# Patient Record
Sex: Female | Born: 1960
Health system: Southern US, Community
[De-identification: ages and names within clinical notes are randomized; demographics above are authoritative.]

---

## 1998-09-13 ENCOUNTER — Other Ambulatory Visit: Admission: RE | Admit: 1998-09-13 | Discharge: 1998-09-13 | Payer: Self-pay | Admitting: *Deleted

## 1999-09-14 ENCOUNTER — Other Ambulatory Visit: Admission: RE | Admit: 1999-09-14 | Discharge: 1999-09-14 | Payer: Self-pay | Admitting: *Deleted

## 2001-09-16 ENCOUNTER — Emergency Department (HOSPITAL_COMMUNITY): Admission: EM | Admit: 2001-09-16 | Discharge: 2001-09-17 | Payer: Self-pay | Admitting: Emergency Medicine

## 2002-01-22 ENCOUNTER — Encounter: Admission: RE | Admit: 2002-01-22 | Discharge: 2002-01-22 | Payer: Self-pay | Admitting: Family Medicine

## 2002-01-22 ENCOUNTER — Encounter: Payer: Self-pay | Admitting: Family Medicine

## 2004-04-11 ENCOUNTER — Other Ambulatory Visit: Admission: RE | Admit: 2004-04-11 | Discharge: 2004-04-11 | Payer: Self-pay | Admitting: Family Medicine

## 2005-03-09 ENCOUNTER — Encounter: Admission: RE | Admit: 2005-03-09 | Discharge: 2005-03-09 | Payer: Self-pay | Admitting: Family Medicine

## 2005-04-17 ENCOUNTER — Other Ambulatory Visit: Admission: RE | Admit: 2005-04-17 | Discharge: 2005-04-17 | Payer: Self-pay | Admitting: Obstetrics and Gynecology

## 2010-10-19 ENCOUNTER — Other Ambulatory Visit: Payer: Self-pay | Admitting: Obstetrics and Gynecology

## 2010-10-19 DIAGNOSIS — R928 Other abnormal and inconclusive findings on diagnostic imaging of breast: Secondary | ICD-10-CM

## 2010-11-06 ENCOUNTER — Ambulatory Visit
Admission: RE | Admit: 2010-11-06 | Discharge: 2010-11-06 | Disposition: A | Discharge status: Home / Self Care | Payer: 59 | Source: Ambulatory Visit | Attending: Obstetrics and Gynecology | Admitting: Obstetrics and Gynecology

## 2010-11-06 DIAGNOSIS — R928 Other abnormal and inconclusive findings on diagnostic imaging of breast: Secondary | ICD-10-CM

## 2014-04-21 ENCOUNTER — Institutional Professional Consult (permissible substitution): Payer: 59 | Admitting: Pulmonary Disease

## 2014-04-23 ENCOUNTER — Encounter: Payer: Self-pay | Admitting: Pulmonary Disease

## 2016-04-17 ENCOUNTER — Other Ambulatory Visit: Payer: Self-pay

## 2016-04-17 ENCOUNTER — Other Ambulatory Visit: Payer: Self-pay | Admitting: Family Medicine

## 2016-04-17 ENCOUNTER — Ambulatory Visit
Admission: RE | Admit: 2016-04-17 | Discharge: 2016-04-17 | Disposition: A | Discharge status: Home / Self Care | Payer: 59 | Source: Ambulatory Visit | Attending: Family Medicine | Admitting: Family Medicine

## 2016-04-17 DIAGNOSIS — R079 Chest pain, unspecified: Secondary | ICD-10-CM

## 2016-04-17 DIAGNOSIS — M25511 Pain in right shoulder: Secondary | ICD-10-CM

## 2016-04-18 ENCOUNTER — Other Ambulatory Visit: Payer: Self-pay | Admitting: Family Medicine

## 2016-04-18 ENCOUNTER — Ambulatory Visit
Admission: RE | Admit: 2016-04-18 | Discharge: 2016-04-18 | Disposition: A | Discharge status: Home / Self Care | Payer: 59 | Source: Ambulatory Visit | Attending: Family Medicine | Admitting: Family Medicine

## 2016-04-18 DIAGNOSIS — R9389 Abnormal findings on diagnostic imaging of other specified body structures: Secondary | ICD-10-CM

## 2016-04-18 DIAGNOSIS — S2241XA Multiple fractures of ribs, right side, initial encounter for closed fracture: Secondary | ICD-10-CM

## 2016-04-18 MED ORDER — IOPAMIDOL (ISOVUE-300) INJECTION 61%
75.0000 mL | Freq: Once | INTRAVENOUS | Status: AC | PRN
  Administered 2016-04-18: 75 mL via INTRAVENOUS

## 2016-05-03 ENCOUNTER — Other Ambulatory Visit (HOSPITAL_COMMUNITY): Payer: Self-pay | Admitting: Orthopedic Surgery

## 2016-05-03 DIAGNOSIS — S2241XA Multiple fractures of ribs, right side, initial encounter for closed fracture: Secondary | ICD-10-CM

## 2016-05-11 ENCOUNTER — Encounter (HOSPITAL_COMMUNITY)
Admission: RE | Admit: 2016-05-11 | Discharge: 2016-05-11 | Disposition: A | Discharge status: Home / Self Care | Payer: 59 | Source: Ambulatory Visit | Attending: Orthopedic Surgery | Admitting: Orthopedic Surgery

## 2016-05-11 ENCOUNTER — Other Ambulatory Visit (HOSPITAL_COMMUNITY): Payer: Self-pay | Admitting: Orthopedic Surgery

## 2016-05-11 DIAGNOSIS — S2241XA Multiple fractures of ribs, right side, initial encounter for closed fracture: Secondary | ICD-10-CM | POA: Diagnosis not present

## 2016-05-11 DIAGNOSIS — X58XXXA Exposure to other specified factors, initial encounter: Secondary | ICD-10-CM | POA: Insufficient documentation

## 2016-05-11 MED ORDER — TECHNETIUM TC 99M MEDRONATE IV KIT
25.0000 | PACK | Freq: Once | INTRAVENOUS | Status: AC | PRN
  Administered 2016-05-11: 25 via INTRAVENOUS

## 2016-05-29 DIAGNOSIS — S2241XD Multiple fractures of ribs, right side, subsequent encounter for fracture with routine healing: Secondary | ICD-10-CM | POA: Diagnosis not present

## 2016-07-27 ENCOUNTER — Other Ambulatory Visit: Payer: Self-pay | Admitting: Family Medicine

## 2016-07-27 ENCOUNTER — Other Ambulatory Visit (HOSPITAL_COMMUNITY)
Admission: RE | Admit: 2016-07-27 | Discharge: 2016-07-27 | Disposition: A | Discharge status: Home / Self Care | Payer: 59 | Source: Ambulatory Visit | Attending: Family Medicine | Admitting: Family Medicine

## 2016-07-27 DIAGNOSIS — Z01411 Encounter for gynecological examination (general) (routine) with abnormal findings: Secondary | ICD-10-CM | POA: Insufficient documentation

## 2016-07-27 DIAGNOSIS — Z1151 Encounter for screening for human papillomavirus (HPV): Secondary | ICD-10-CM | POA: Diagnosis not present

## 2016-07-27 DIAGNOSIS — Z1231 Encounter for screening mammogram for malignant neoplasm of breast: Secondary | ICD-10-CM

## 2016-08-02 LAB — CYTOLOGY - PAP
Diagnosis: NEGATIVE
HPV: NOT DETECTED

## 2016-08-08 DIAGNOSIS — I10 Essential (primary) hypertension: Secondary | ICD-10-CM | POA: Diagnosis not present

## 2016-08-14 DIAGNOSIS — D0461 Carcinoma in situ of skin of right upper limb, including shoulder: Secondary | ICD-10-CM | POA: Diagnosis not present

## 2016-08-14 DIAGNOSIS — L570 Actinic keratosis: Secondary | ICD-10-CM | POA: Diagnosis not present

## 2016-08-15 ENCOUNTER — Ambulatory Visit: Payer: 59

## 2016-08-15 ENCOUNTER — Ambulatory Visit
Admission: RE | Admit: 2016-08-15 | Discharge: 2016-08-15 | Disposition: A | Discharge status: Home / Self Care | Payer: 59 | Source: Ambulatory Visit | Attending: Family Medicine | Admitting: Family Medicine

## 2016-08-15 DIAGNOSIS — Z1231 Encounter for screening mammogram for malignant neoplasm of breast: Secondary | ICD-10-CM | POA: Diagnosis not present

## 2017-10-17 ENCOUNTER — Other Ambulatory Visit: Payer: Self-pay | Admitting: Family Medicine

## 2017-10-17 DIAGNOSIS — Z1231 Encounter for screening mammogram for malignant neoplasm of breast: Secondary | ICD-10-CM

## 2017-11-07 ENCOUNTER — Ambulatory Visit: Payer: 59

## 2017-11-08 ENCOUNTER — Ambulatory Visit
Admission: RE | Admit: 2017-11-08 | Discharge: 2017-11-08 | Disposition: A | Discharge status: Home / Self Care | Payer: BLUE CROSS/BLUE SHIELD | Source: Ambulatory Visit | Attending: Family Medicine | Admitting: Family Medicine

## 2017-11-08 DIAGNOSIS — Z1231 Encounter for screening mammogram for malignant neoplasm of breast: Secondary | ICD-10-CM

## 2018-09-30 ENCOUNTER — Other Ambulatory Visit: Payer: Self-pay | Admitting: Family Medicine

## 2018-09-30 DIAGNOSIS — Z1231 Encounter for screening mammogram for malignant neoplasm of breast: Secondary | ICD-10-CM

## 2018-11-20 ENCOUNTER — Ambulatory Visit
Admission: RE | Admit: 2018-11-20 | Discharge: 2018-11-20 | Disposition: A | Discharge status: Home / Self Care | Payer: BLUE CROSS/BLUE SHIELD | Source: Ambulatory Visit | Attending: Family Medicine | Admitting: Family Medicine

## 2018-11-20 ENCOUNTER — Other Ambulatory Visit: Payer: Self-pay

## 2018-11-20 DIAGNOSIS — Z1231 Encounter for screening mammogram for malignant neoplasm of breast: Secondary | ICD-10-CM

## 2018-11-26 ENCOUNTER — Other Ambulatory Visit: Payer: Self-pay | Admitting: Family Medicine

## 2018-11-26 DIAGNOSIS — R2231 Localized swelling, mass and lump, right upper limb: Secondary | ICD-10-CM

## 2018-11-27 ENCOUNTER — Ambulatory Visit
Admission: RE | Admit: 2018-11-27 | Discharge: 2018-11-27 | Disposition: A | Discharge status: Home / Self Care | Payer: BC Managed Care – PPO | Source: Ambulatory Visit | Attending: Family Medicine | Admitting: Family Medicine

## 2018-11-27 DIAGNOSIS — R2231 Localized swelling, mass and lump, right upper limb: Secondary | ICD-10-CM

## 2019-03-23 ENCOUNTER — Other Ambulatory Visit: Payer: Self-pay

## 2019-03-23 DIAGNOSIS — Z20822 Contact with and (suspected) exposure to covid-19: Secondary | ICD-10-CM

## 2019-03-24 LAB — NOVEL CORONAVIRUS, NAA: SARS-CoV-2, NAA: NOT DETECTED

## 2019-12-08 ENCOUNTER — Other Ambulatory Visit (HOSPITAL_COMMUNITY)
Admission: RE | Admit: 2019-12-08 | Discharge: 2019-12-08 | Disposition: A | Discharge status: Home / Self Care | Payer: 59 | Source: Ambulatory Visit | Attending: Family Medicine | Admitting: Family Medicine

## 2019-12-08 ENCOUNTER — Other Ambulatory Visit: Payer: Self-pay | Admitting: Family Medicine

## 2019-12-08 DIAGNOSIS — Z01411 Encounter for gynecological examination (general) (routine) with abnormal findings: Secondary | ICD-10-CM | POA: Diagnosis not present

## 2019-12-10 LAB — CYTOLOGY - PAP
Comment: NEGATIVE
Diagnosis: NEGATIVE
High risk HPV: NEGATIVE

## 2019-12-22 ENCOUNTER — Other Ambulatory Visit: Payer: Self-pay | Admitting: Family Medicine

## 2019-12-22 DIAGNOSIS — Z1231 Encounter for screening mammogram for malignant neoplasm of breast: Secondary | ICD-10-CM

## 2019-12-31 ENCOUNTER — Other Ambulatory Visit: Payer: Self-pay

## 2019-12-31 ENCOUNTER — Ambulatory Visit
Admission: RE | Admit: 2019-12-31 | Discharge: 2019-12-31 | Disposition: A | Discharge status: Home / Self Care | Payer: 59 | Source: Ambulatory Visit | Attending: Family Medicine | Admitting: Family Medicine

## 2019-12-31 DIAGNOSIS — Z1231 Encounter for screening mammogram for malignant neoplasm of breast: Secondary | ICD-10-CM

## 2020-01-27 ENCOUNTER — Other Ambulatory Visit (HOSPITAL_BASED_OUTPATIENT_CLINIC_OR_DEPARTMENT_OTHER): Payer: Self-pay

## 2020-01-27 DIAGNOSIS — R0902 Hypoxemia: Secondary | ICD-10-CM

## 2020-01-27 DIAGNOSIS — G4733 Obstructive sleep apnea (adult) (pediatric): Secondary | ICD-10-CM

## 2020-02-27 ENCOUNTER — Other Ambulatory Visit: Payer: Self-pay

## 2020-02-27 ENCOUNTER — Ambulatory Visit (HOSPITAL_BASED_OUTPATIENT_CLINIC_OR_DEPARTMENT_OTHER): Payer: 59 | Attending: Internal Medicine | Admitting: Internal Medicine

## 2020-02-27 DIAGNOSIS — G4733 Obstructive sleep apnea (adult) (pediatric): Secondary | ICD-10-CM | POA: Diagnosis not present

## 2020-02-27 DIAGNOSIS — R0902 Hypoxemia: Secondary | ICD-10-CM

## 2020-03-02 NOTE — Procedures (Signed)
NAME: Felicia Summers DATE OF BIRTH:  December 03, 1960 MEDICAL RECORD NUMBER [id number]  LOCATION: [facility]  PHYSICIAN: Deretha Emory  DATE OF STUDY: 02/27/2020  SLEEP STUDY TYPE: Positive Airway Pressure Titration               REFERRING PHYSICIAN: Deretha Emory, MD  INDICATION FOR STUDY: Severe OSA and severe hypoxemia on HSAT. Concern for OHS.   EPWORTH SLEEPINESS SCORE:  10 HEIGHT: 5\' 3"  (160 cm)  WEIGHT: 230 lb (104.3 kg)    Body mass index is 40.74 kg/m.  NECK SIZE: 16 in.  MEDICATIONS Patient self administered medications include: Venaflaxine, AMLODIPINE, LISINOPRIL. Medications administered during study include No sleep medicine administered.  SLEEP STUDY TECHNIQUE The patient underwent an attended overnight polysomnography titration to assess the effects of cpap therapy. The following variables were monitored: EEG (C4-A1, C3-A2, O1-A2, O2-A1, F3-M2, F4-M1), EOG, submental and leg EMG, ECG, oxyhemoglobin saturation by pulse oximetry, thoracic and abdominal respiratory effort belts, nasal/oral airflow by pressure sensor, body position sensor and snoring sensor. CPAP pressure was titrated to eliminate apneas, hypopneas and oxygen desaturation.  TECHNICAL COMMENTS Comments added by Technician: Patient had more than two awakenings to use the bathroom Comments added by Scorer: N/A  SLEEP ARCHITECTURE The study was initiated at 10:15:36 PM and terminated at 5:09:01 AM. Total recorded time was 413.4 minutes. EEG confirmed total sleep time was 288.5 minutes yielding a sleep efficiency of 69.8%%. Sleep onset after lights out was 25.4 minutes with a REM latency of 127.0 minutes. The patient spent 6.1%% of the night in stage N1 sleep, 48.5%% in stage N2 sleep, 0.0%% in stage N3 and 45.4% in REM. The Arousal Index was 13.7/hour.  RESPIRATORY PARAMETERS The overall AHI was 21.6 per hour, and the RDI was 23.3 events/hour with a central apnea index of 2.9per  hour. The patient was started on CPAP 4. Pressures were increased due to continued events. She was eventually changed to BPAP due to higher pressures and CPAP intolerance. The most appropriate setting of BiPAP was IPAP/EPAP 21/17 cm H2O. At this setting, the sleep efficiency was 96 % and the patient was supine for 0%. The AHI was 4.7 events per hour, and the RDI was 4.7 events/hour (with 2.9 central events) and the arousal index was 14.1 per hour.The oxygen nadir was 93.0% at this pressure. It is noted that at CPAP of 16, the AHI was 8.1 events/hour and oxygen min was 89%.   LEG MOVEMENT DATA The total leg movements were 284 with a resulting leg movement index of 59.1. Associated arousal with leg movement index was 0.0.  CARDIAC DATA The underlying cardiac rhythm was most consistent with sinus rhythm. Mean heart rate during sleep was 77.3 bpm. Additional rhythm abnormalities include None.  IMPRESSIONS - Severe Obstructive Sleep Apnea - Severe Oxygen Desaturation - Optimal pressure attained with resolution in apnea and hypoxemia - Evidence of REM rebound.  - Patient indicated her sleep with this study was better than usual sleep at home  DIAGNOSIS - Obstructive Sleep Apnea (G47.33)  RECOMMENDATIONS - Trial of BiPAP therapy on 21/17 cm H2O with a Medium size Fisher&Paykel Full Face Mask F&P Vitera (new) mask and heated humidification. If BPAP is not tolerated, CPAP 15 cm H2O may be adequate and resolved hypoxemia.   03-27-1988 Sleep specialist, American Board of Internal Medicine  ELECTRONICALLY SIGNED ON:  03/02/2020, 7:46 PM [facility] PH: [phone]   FX: [phone]  [facility]

## 2021-01-30 ENCOUNTER — Other Ambulatory Visit: Payer: Self-pay | Admitting: Family Medicine

## 2021-01-30 DIAGNOSIS — Z1231 Encounter for screening mammogram for malignant neoplasm of breast: Secondary | ICD-10-CM

## 2021-03-02 ENCOUNTER — Ambulatory Visit
Admission: RE | Admit: 2021-03-02 | Discharge: 2021-03-02 | Disposition: A | Discharge status: Home / Self Care | Payer: BC Managed Care – PPO | Source: Ambulatory Visit | Attending: Family Medicine | Admitting: Family Medicine

## 2021-03-02 ENCOUNTER — Other Ambulatory Visit: Payer: Self-pay

## 2021-03-02 DIAGNOSIS — Z1231 Encounter for screening mammogram for malignant neoplasm of breast: Secondary | ICD-10-CM

## 2022-01-07 IMAGING — MG MM DIGITAL SCREENING BILAT W/ TOMO AND CAD
6 of 12 series · 6 of 36 positions shown · non-contrast
Comparison: Previous exam(s).

CLINICAL DATA: Screening.

EXAM:
DIGITAL SCREENING BILATERAL MAMMOGRAM WITH TOMOSYNTHESIS AND CAD
TECHNIQUE: Bilateral screening digital craniocaudal and mediolateral oblique
mammograms were obtained. Bilateral screening digital breast
tomosynthesis was performed. The images were evaluated with
computer-aided detection.

[L CC synth-2D (1 of 2)]
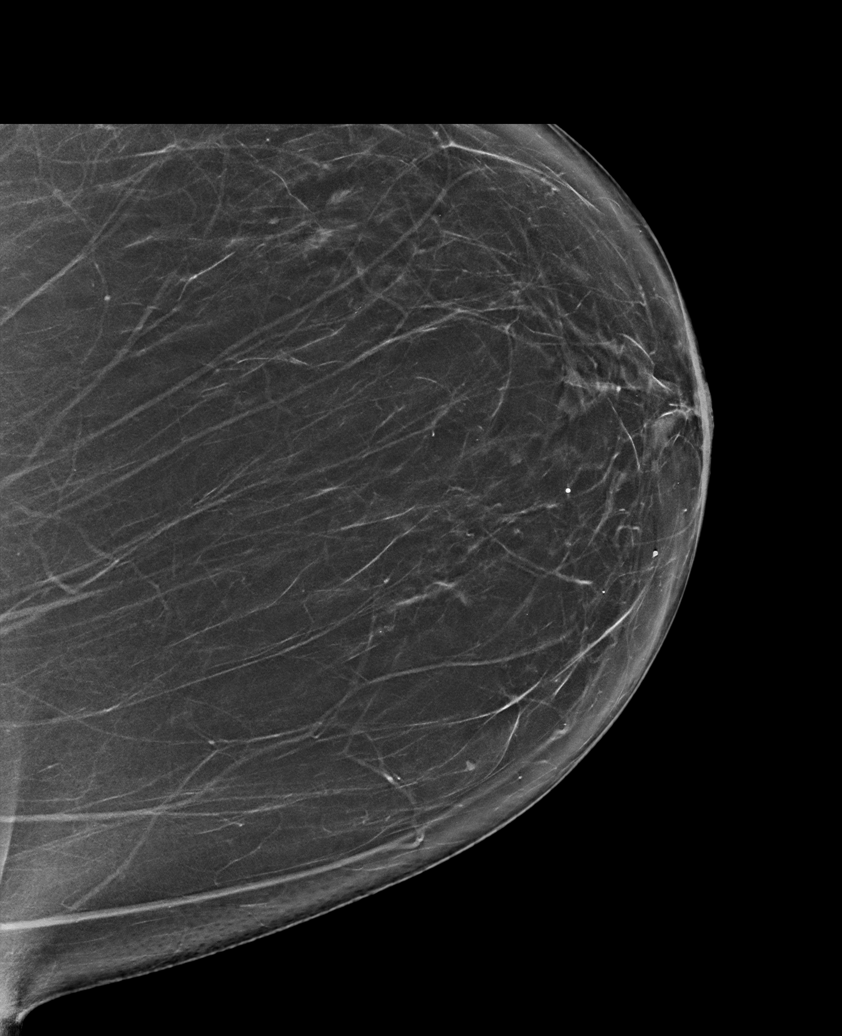

[R MLO synth-2D (1 of 2)]
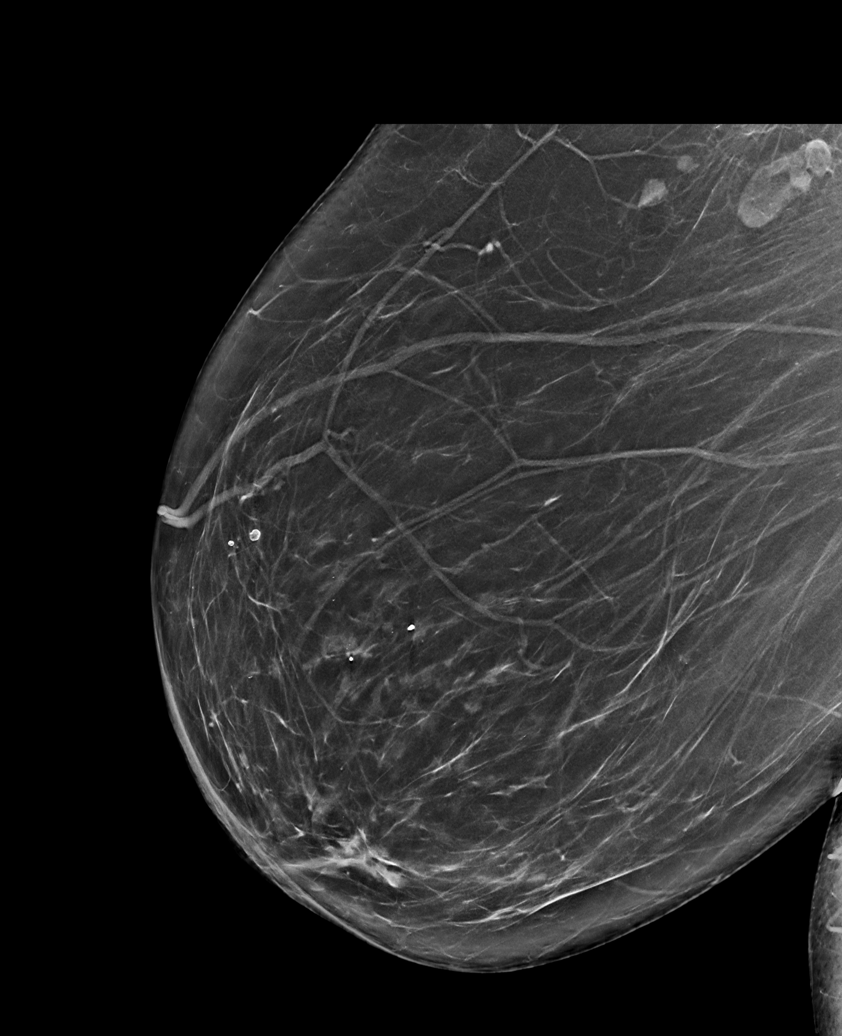

[L CC synth-2D (2 of 2)]
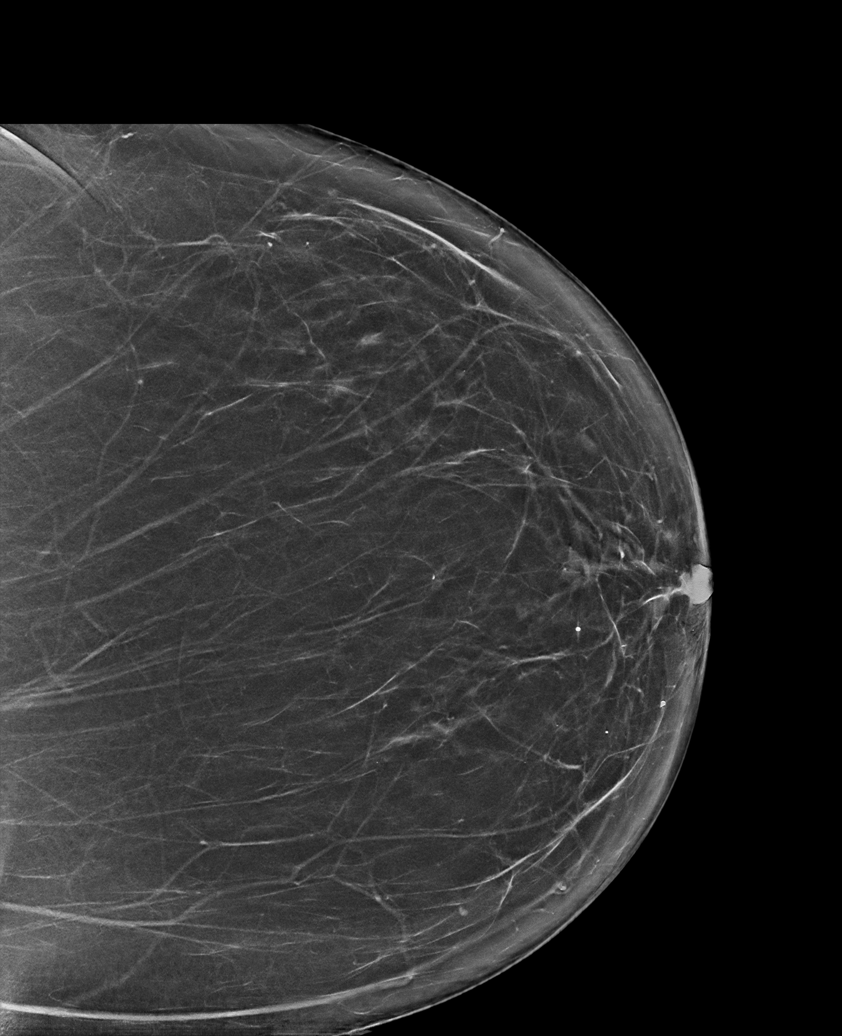

[R CC synth-2D]
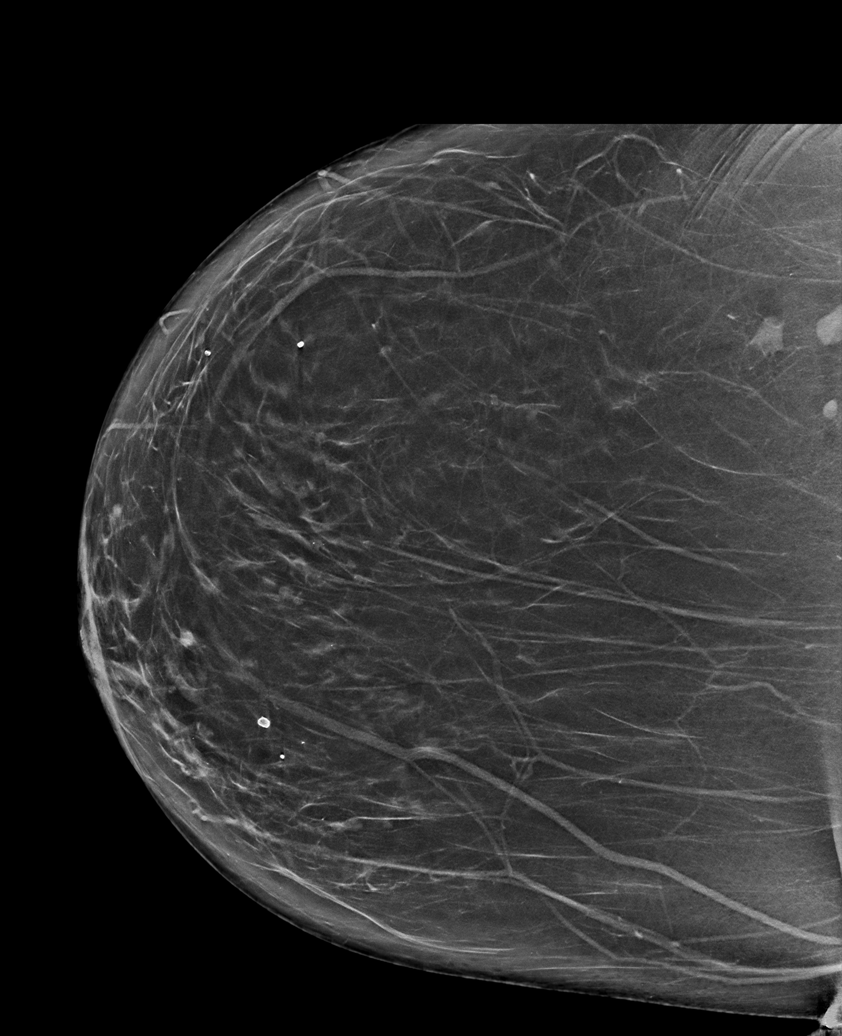

[R MLO synth-2D (2 of 2)]
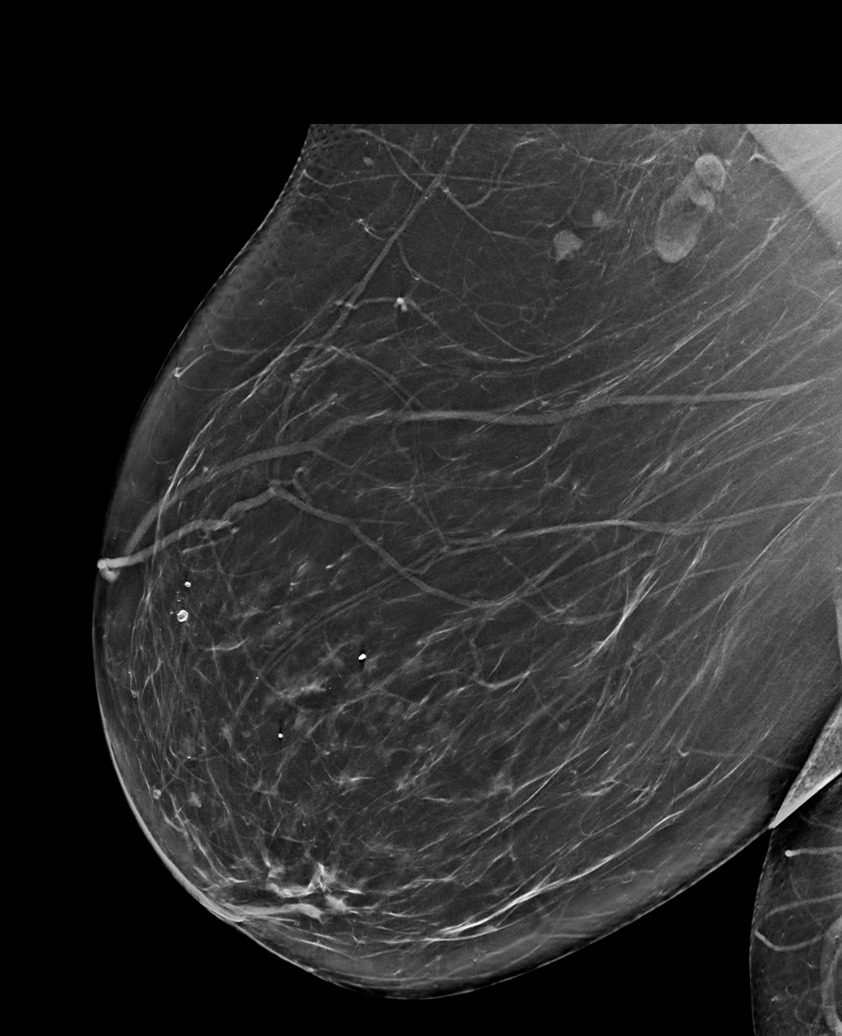

[L MLO synth-2D]
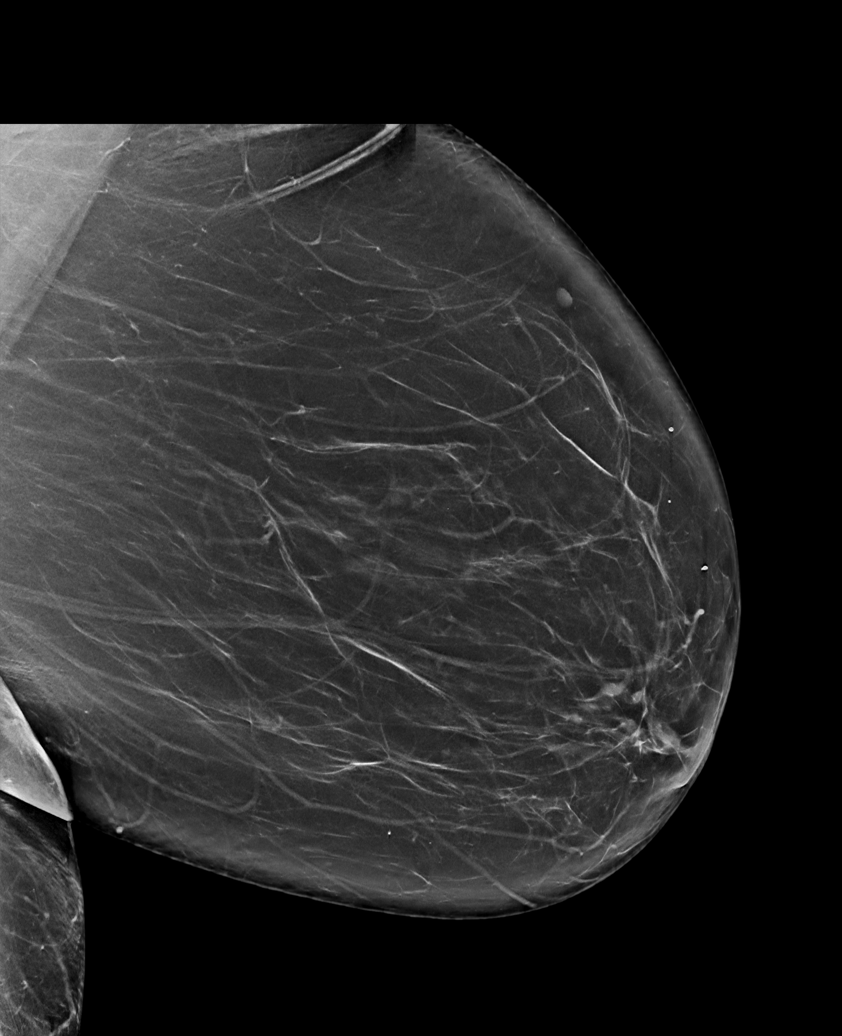

[6 of 36 positions shown; findings below may reference images not displayed]

ACR Breast Density Category b: There are scattered areas of
fibroglandular density.
FINDINGS: There are no findings suspicious for malignancy.
IMPRESSION: No mammographic evidence of malignancy. A result letter of this
screening mammogram will be mailed directly to the patient.

RECOMMENDATION:
Screening mammogram in one year. (Code:51-O-LD2)

BI-RADS CATEGORY  1: Negative.

## 2022-03-23 ENCOUNTER — Other Ambulatory Visit: Payer: Self-pay | Admitting: Family Medicine

## 2022-03-23 DIAGNOSIS — E2839 Other primary ovarian failure: Secondary | ICD-10-CM

## 2022-04-02 ENCOUNTER — Other Ambulatory Visit: Payer: Self-pay | Admitting: Family Medicine

## 2022-04-02 DIAGNOSIS — Z1231 Encounter for screening mammogram for malignant neoplasm of breast: Secondary | ICD-10-CM

## 2022-05-04 ENCOUNTER — Ambulatory Visit
Admission: RE | Admit: 2022-05-04 | Discharge: 2022-05-04 | Disposition: A | Discharge status: Home / Self Care | Payer: BC Managed Care – PPO | Source: Ambulatory Visit | Attending: Family Medicine | Admitting: Family Medicine

## 2022-05-04 DIAGNOSIS — Z1231 Encounter for screening mammogram for malignant neoplasm of breast: Secondary | ICD-10-CM

## 2022-09-20 ENCOUNTER — Ambulatory Visit
Admission: RE | Admit: 2022-09-20 | Discharge: 2022-09-20 | Disposition: A | Discharge status: Home / Self Care | Payer: BC Managed Care – PPO | Source: Ambulatory Visit | Attending: Family Medicine | Admitting: Family Medicine

## 2022-09-20 DIAGNOSIS — E2839 Other primary ovarian failure: Secondary | ICD-10-CM

## 2023-02-11 ENCOUNTER — Encounter: Payer: Self-pay | Admitting: Nurse Practitioner

## 2023-02-11 ENCOUNTER — Ambulatory Visit (INDEPENDENT_AMBULATORY_CARE_PROVIDER_SITE_OTHER): Payer: BC Managed Care – PPO | Admitting: Nurse Practitioner

## 2023-02-11 VITALS — BP 117/80 | HR 70 | Temp 98.2°F | Ht 61.0 in | Wt 219.0 lb

## 2023-02-11 DIAGNOSIS — Z6841 Body Mass Index (BMI) 40.0 and over, adult: Secondary | ICD-10-CM

## 2023-02-11 DIAGNOSIS — E039 Hypothyroidism, unspecified: Secondary | ICD-10-CM | POA: Diagnosis not present

## 2023-02-11 DIAGNOSIS — E785 Hyperlipidemia, unspecified: Secondary | ICD-10-CM | POA: Insufficient documentation

## 2023-02-11 DIAGNOSIS — G4733 Obstructive sleep apnea (adult) (pediatric): Secondary | ICD-10-CM

## 2023-02-11 DIAGNOSIS — I10 Essential (primary) hypertension: Secondary | ICD-10-CM | POA: Diagnosis not present

## 2023-02-11 NOTE — Progress Notes (Signed)
Office: [phone]  /  Fax: [phone]   Initial Visit  Felicia Summers was seen in clinic today to evaluate for obesity. She is interested in losing weight to improve overall health and reduce the risk of weight related complications. She presents today to review program treatment options, initial physical assessment, and evaluation.     She was referred by: PCP  When asked what else they would like to accomplish? She states: Improve energy levels and physical activity, Improve existing medical conditions, Reduce number of medications, Improve quality of life, and Lose a target amount of weight : 80 lbs  Weight history:  She started gaining weight in her 20s and her weight has fluctuated over the years.    When asked how has your weight affected you? She states: Contributed to orthopedic problems or mobility issues, Having fatigue, and Having poor endurance  Some associated conditions: HTN, OSAS on CPAP, HLD, hypothyroidism   Contributing factors: Family history and Pregnancy  Weight promoting medications identified: None  Current nutrition plan: None  Current level of physical activity: None  Current or previous pharmacotherapy: None  Response to medication: Never tried medications   Past medical history includes:  History reviewed. No pertinent past medical history.   Objective:   BP 117/80   Pulse 70   Temp 98.2 F (36.8 C)   Ht 5\' 1"  (1.549 m)   Wt 219 lb (99.3 kg)   SpO2 99%   BMI 41.38 kg/m  She was weighed on the bioimpedance scale: Body mass index is 41.38 kg/m.  Peak Weight:250 lbs , Body Fat%:49.8, Visceral Fat Rating:17, Weight trend over the last 12 months: Decreasing  General:  Alert, oriented and cooperative. Patient is in no acute distress.  Respiratory: Normal respiratory effort, no problems with respiration noted   Gait: able to ambulate independently  Mental Status: Normal mood and affect. Normal behavior. Normal judgment and thought  content.   DIAGNOSTIC DATA REVIEWED:  BMET No results found for: "NA", "K", "CL", "CO2", "GLUCOSE", "BUN", "CREATININE", "CALCIUM", "GFRNONAA", "GFRAA" No results found for: "HGBA1C" No results found for: "INSULIN" CBC No results found for: "WBC", "RBC", "HGB", "HCT", "PLT", "MCV", "MCH", "MCHC", "RDW" Iron/TIBC/Ferritin/ %Sat No results found for: "IRON", "TIBC", "FERRITIN", "IRONPCTSAT" Lipid Panel  No results found for: "CHOL", "TRIG", "HDL", "CHOLHDL", "VLDL", "LDLCALC", "LDLDIRECT" Hepatic Function Panel  No results found for: "PROT", "ALBUMIN", "AST", "ALT", "ALKPHOS", "BILITOT", "BILIDIR", "IBILI" No results found for: "TSH"   Assessment and Plan:   Hypertension, unspecified type Continue to follow-up with PCP.  Continue medications as directed.  Hypothyroidism, unspecified type Continue to follow-up with PCP.  Continue medications as directed.  OSA (obstructive sleep apnea) Continue CPAP nightly  Morbid obesity (HCC)  BMI 40.0-44.9, adult (HCC)        Obesity Treatment / Action Plan:  Patient will work on garnering support from family and friends to begin weight loss journey. Will work on eliminating or reducing the presence of highly palatable, calorie dense foods in the home. Will complete provided nutritional and psychosocial assessment questionnaire before the next appointment. Will be scheduled for indirect calorimetry to determine resting energy expenditure in a fasting state.  This will allow [place] to create a reduced calorie, high-protein meal plan to promote loss of fat mass while preserving muscle mass. Counseled on the health benefits of losing 5%-15% of total body weight. Was counseled on nutritional approaches to weight loss and benefits of reducing processed foods and consuming plant-based foods and high quality protein  as part of nutritional weight management. Was counseled on pharmacotherapy and role as an adjunct in weight management.   Obesity  Education Performed Today:  She was weighed on the bioimpedance scale and results were discussed and documented in the synopsis.  We discussed obesity as a disease and the importance of a more detailed evaluation of all the factors contributing to the disease.  We discussed the importance of long term lifestyle changes which include nutrition, exercise and behavioral modifications as well as the importance of customizing this to her specific health and social needs.  We discussed the benefits of reaching a healthier weight to alleviate the symptoms of existing conditions and reduce the risks of the biomechanical, metabolic and psychological effects of obesity.  Felicia Summers appears to be in the action stage of change and states they are ready to start intensive lifestyle modifications and behavioral modifications.  30 minutes was spent today on this visit including the above counseling, pre-visit chart review, and post-visit documentation.  Reviewed by clinician on day of visit: allergies, medications, problem list, medical history, surgical history, family history, social history, and previous encounter notes pertinent to obesity diagnosis.    Theodis Sato Felicia Rosenfield FNP-C

## 2023-06-11 ENCOUNTER — Other Ambulatory Visit: Payer: Self-pay | Admitting: Family Medicine

## 2023-06-11 DIAGNOSIS — Z1231 Encounter for screening mammogram for malignant neoplasm of breast: Secondary | ICD-10-CM

## 2023-06-21 ENCOUNTER — Ambulatory Visit
Admission: RE | Admit: 2023-06-21 | Discharge: 2023-06-21 | Disposition: A | Discharge status: Home / Self Care | Payer: 59 | Source: Ambulatory Visit | Attending: Family Medicine | Admitting: Family Medicine

## 2023-06-21 DIAGNOSIS — Z1231 Encounter for screening mammogram for malignant neoplasm of breast: Secondary | ICD-10-CM

## 2023-11-23 ENCOUNTER — Other Ambulatory Visit: Payer: Self-pay

## 2023-11-23 ENCOUNTER — Encounter (HOSPITAL_BASED_OUTPATIENT_CLINIC_OR_DEPARTMENT_OTHER): Payer: Self-pay

## 2023-11-23 ENCOUNTER — Emergency Department (HOSPITAL_BASED_OUTPATIENT_CLINIC_OR_DEPARTMENT_OTHER)
Admission: EM | Admit: 2023-11-23 | Discharge: 2023-11-23 | Disposition: A | Discharge status: Home / Self Care | Attending: Emergency Medicine | Admitting: Emergency Medicine

## 2023-11-23 DIAGNOSIS — T783XXA Angioneurotic edema, initial encounter: Secondary | ICD-10-CM | POA: Insufficient documentation

## 2023-11-23 DIAGNOSIS — I10 Essential (primary) hypertension: Secondary | ICD-10-CM | POA: Diagnosis not present

## 2023-11-23 DIAGNOSIS — Z79899 Other long term (current) drug therapy: Secondary | ICD-10-CM | POA: Insufficient documentation

## 2023-11-23 DIAGNOSIS — R220 Localized swelling, mass and lump, head: Secondary | ICD-10-CM | POA: Diagnosis present

## 2023-11-23 LAB — BASIC METABOLIC PANEL WITH GFR
Anion gap: 11 (ref 5–15)
BUN: 21 mg/dL (ref 8–23)
CO2: 27 mmol/L (ref 22–32)
Calcium: 10.2 mg/dL (ref 8.9–10.3)
Chloride: 99 mmol/L (ref 98–111)
Creatinine, Ser: 0.73 mg/dL (ref 0.44–1.00)
GFR, Estimated: >60 mL/min (ref >60)
Glucose, Bld: 101 mg/dL — ABNORMAL HIGH (ref 70–99)
Potassium: 3.9 mmol/L (ref 3.5–5.1)
Sodium: 137 mmol/L (ref 135–145)

## 2023-11-23 LAB — CBC
HCT: 36.3 % (ref 36.0–46.0)
Hemoglobin: 12.4 g/dL (ref 12.0–15.0)
MCH: 29.5 pg (ref 26.0–34.0)
MCHC: 34.2 g/dL (ref 30.0–36.0)
MCV: 86.2 fL (ref 80.0–100.0)
Platelets: 213 K/uL (ref 150–400)
RBC: 4.21 MIL/uL (ref 3.87–5.11)
RDW: 14.6 % (ref 11.5–15.5)
WBC: 7.6 K/uL (ref 4.0–10.5)
nRBC: 0 % (ref 0.0–0.2)

## 2023-11-23 MED ORDER — LORATADINE 10 MG PO TABS: 10.0000 mg | ORAL_TABLET | Freq: Every day | ORAL | 0 refills | Status: AC

## 2023-11-23 MED ORDER — DEXAMETHASONE SODIUM PHOSPHATE 10 MG/ML IJ SOLN
10.0000 mg | Freq: Once | INTRAMUSCULAR | Status: AC
  Administered 2023-11-23: 10 mg via INTRAVENOUS
  Filled 2023-11-23: qty 1

## 2023-11-23 MED ORDER — PREDNISONE 50 MG PO TABS
50.0000 mg | ORAL_TABLET | Freq: Every day | ORAL | 0 refills | Status: AC
Start: 2023-11-23 — End: ?

## 2023-11-23 MED ORDER — EPINEPHRINE 0.3 MG/0.3ML IJ SOAJ
0.3000 mg | INTRAMUSCULAR | 0 refills | Status: AC | PRN
Start: 2023-11-23 — End: ?

## 2023-11-23 MED ORDER — EPINEPHRINE 0.3 MG/0.3ML IJ SOAJ
0.3000 mg | Freq: Once | INTRAMUSCULAR | Status: AC
  Administered 2023-11-23: 0.3 mg via INTRAMUSCULAR
  Filled 2023-11-23: qty 0.3

## 2023-11-23 MED ORDER — DIPHENHYDRAMINE HCL 50 MG/ML IJ SOLN
25.0000 mg | Freq: Once | INTRAMUSCULAR | Status: AC
  Administered 2023-11-23: 25 mg via INTRAVENOUS
  Filled 2023-11-23: qty 1

## 2023-11-23 MED ORDER — FAMOTIDINE IN NACL 20-0.9 MG/50ML-% IV SOLN
20.0000 mg | Freq: Once | INTRAVENOUS | Status: AC
  Administered 2023-11-23: 20 mg via INTRAVENOUS
  Filled 2023-11-23: qty 50

## 2023-11-23 NOTE — ED Notes (Signed)
 She feels great. Her tongue swelling is now nearly imperceptible, and she continues to breath and swallow normally.

## 2023-11-23 NOTE — Discharge Instructions (Addendum)
 Stop taking your lisinopril medication.  This may be the cause of the episode of tongue swelling you had today.  I have also written prescriptions for steroids and antihistamines to continue for the next few days.  I prescribed an epinephrine  pen as a precaution.  If you were to have severe recurrent allergic reaction causing symptoms such as difficulty breathing ,difficulty swallowing,hives  or tongue swelling you would give yourself the injection and call 911.

## 2023-11-23 NOTE — ED Triage Notes (Signed)
 She noted left-sided tongue edema upon awakening from a nap today at ~ 0900. Her skin is normal, warm and dry and she is breathing normally. She states this has never happened before.

## 2023-11-23 NOTE — ED Provider Notes (Signed)
 [place] EMERGENCY DEPARTMENT AT [facility] Provider Note   CSN: [id number] Arrival date & time: 11/23/23  1000     Patient presents with: Oral Swelling   Felicia Summers is a 63 y.o. female.  {Add pertinent medical, surgical, social history, OB history to HPI:32947} HPI    Patient has a history of hypertension.  Patient presents ED with complaints of tongue swelling that she noticed this morning.  Patient states when she woke up at 9 AM she noticed that her tongue was swollen.  Patient denies any lip swelling.  She has not noticed a rash.  She is not having any itching.  Patient states she still able to speak and swallow.  Patient has never had a reaction like this in the past.  Patient is on lisinopril but has been on this for a long period of time.  Patient states the only other new thing that she has tried is a type of weight loss regimen  Prior to Admission medications   Medication Sig Start Date End Date Taking? Authorizing Provider  amLODipine (NORVASC) 2.5 MG tablet Take 2.5 mg by mouth daily.    [provider]  levothyroxine (SYNTHROID) 50 MCG tablet Take 50 mcg by mouth daily.    [provider]  lisinopril-hydrochlorothiazide (ZESTORETIC) 10-12.5 MG tablet Take 1 tablet by mouth daily.    [provider]  venlafaxine XR (EFFEXOR-XR) 150 MG 24 hr capsule Take 150 mg by mouth daily with breakfast.    [provider]  venlafaxine XR (EFFEXOR-XR) 75 MG 24 hr capsule Take 75 mg by mouth daily with breakfast.    [provider]    Allergies: Amoxicillin-pot clavulanate, Sulfa antibiotics, and Sulfamethoxazole-trimethoprim    Review of Systems  Updated Vital Signs BP 108/68 (BP Location: Right Arm)   Pulse 67   Temp 97.9 F (36.6 C) (Oral)   Resp 14   SpO2 95%   Physical Exam Vitals and nursing note reviewed.  Constitutional:      General: She is not in acute distress.    Appearance: She is well-developed.   HENT:     Head: Normocephalic and atraumatic.     Comments: Edema noted to the tongue, no lip swelling    Right Ear: External ear normal.     Left Ear: External ear normal.  Eyes:     General: No scleral icterus.       Right eye: No discharge.        Left eye: No discharge.     Conjunctiva/sclera: Conjunctivae normal.  Neck:     Trachea: No tracheal deviation.  Cardiovascular:     Rate and Rhythm: Normal rate and regular rhythm.  Pulmonary:     Effort: Pulmonary effort is normal. No respiratory distress.     Breath sounds: No stridor. Rhonchi present. No wheezing or rales.  Abdominal:     General: Bowel sounds are normal. There is no distension.     Palpations: Abdomen is soft.     Tenderness: There is no abdominal tenderness. There is no guarding or rebound.  Musculoskeletal:        General: No tenderness or deformity.     Cervical back: Neck supple.  Skin:    General: Skin is warm and dry.     Findings: No rash.     Comments: No urticaria, no erythema  Neurological:     General: No focal deficit present.     Mental Status: She is alert.  Cranial Nerves: No cranial nerve deficit, dysarthria or facial asymmetry.     Sensory: No sensory deficit.     Motor: No abnormal muscle tone or seizure activity.     Coordination: Coordination normal.  Psychiatric:        Mood and Affect: Mood normal.     (all labs ordered are listed, but only abnormal results are displayed) Labs Reviewed  CBC  BASIC METABOLIC PANEL WITH GFR    EKG: None  Radiology: No results found.  {Document cardiac monitor, telemetry assessment procedure when appropriate:32947} Procedures   Medications Ordered in the ED  diphenhydrAMINE  (BENADRYL ) injection 25 mg (has no administration in time range)  dexamethasone  (DECADRON ) injection 10 mg (has no administration in time range)  famotidine  (PEPCID ) IVPB 20 mg premix (has no administration in time range)  EPINEPHrine  (EPI-PEN) injection 0.3 mg  (0.3 mg Intramuscular Given 11/23/23 1042)      {Click here for ABCD2, HEART and other calculators REFRESH Note before signing:1}                              Medical Decision Making Amount and/or Complexity of Data Reviewed Labs: ordered.  Risk Prescription drug management.   ***  {Document critical care time when appropriate  Document review of labs and clinical decision tools ie CHADS2VASC2, etc  Document your independent review of radiology images and any outside records  Document your discussion with family members, caretakers and with consultants  Document social determinants of health affecting pt's care  Document your decision making why or why not admission, treatments were needed:32947:::1}   Final diagnoses:  None    ED Discharge Orders     None

## 2023-11-23 NOTE — ED Notes (Signed)
 She continues to feel well and breath normally. Dr. Randol gives pt. And her visitor extensive instructions and fields questions at this time.

## 2024-01-01 ENCOUNTER — Ambulatory Visit: Payer: Self-pay | Admitting: Allergy

## 2024-02-20 ENCOUNTER — Ambulatory Visit: Payer: Self-pay | Admitting: Allergy
